# Patient Record
Sex: Female | Born: 1960 | Race: White | Hispanic: No | Marital: Married | Smoking: Never smoker
Health system: Southern US, Community
[De-identification: ages and names within clinical notes are randomized; demographics above are authoritative.]

## PROBLEM LIST (undated history)

## (undated) DIAGNOSIS — J309 Allergic rhinitis, unspecified: Secondary | ICD-10-CM

## (undated) DIAGNOSIS — I341 Nonrheumatic mitral (valve) prolapse: Secondary | ICD-10-CM

## (undated) DIAGNOSIS — I493 Ventricular premature depolarization: Secondary | ICD-10-CM

## (undated) DIAGNOSIS — Q638 Other specified congenital malformations of kidney: Secondary | ICD-10-CM

## (undated) DIAGNOSIS — F411 Generalized anxiety disorder: Secondary | ICD-10-CM

## (undated) DIAGNOSIS — E785 Hyperlipidemia, unspecified: Secondary | ICD-10-CM

## (undated) DIAGNOSIS — E559 Vitamin D deficiency, unspecified: Secondary | ICD-10-CM

## (undated) DIAGNOSIS — R002 Palpitations: Secondary | ICD-10-CM

## (undated) DIAGNOSIS — C801 Malignant (primary) neoplasm, unspecified: Secondary | ICD-10-CM

## (undated) HISTORY — PX: TONSILLECTOMY: SUR1361

## (undated) HISTORY — DX: Malignant (primary) neoplasm, unspecified: C80.1

## (undated) HISTORY — DX: Vitamin D deficiency, unspecified: E55.9

## (undated) HISTORY — DX: Hyperlipidemia, unspecified: E78.5

## (undated) HISTORY — DX: Generalized anxiety disorder: F41.1

## (undated) HISTORY — PX: BREAST ENHANCEMENT SURGERY: SHX7

## (undated) HISTORY — DX: Nonrheumatic mitral (valve) prolapse: I34.1

## (undated) HISTORY — DX: Palpitations: R00.2

## (undated) HISTORY — DX: Other specified congenital malformations of kidney: Q63.8

## (undated) HISTORY — DX: Ventricular premature depolarization: I49.3

## (undated) HISTORY — DX: Allergic rhinitis, unspecified: J30.9

---

## 2012-09-14 ENCOUNTER — Emergency Department: Payer: Self-pay | Admitting: Emergency Medicine

## 2012-09-14 ENCOUNTER — Ambulatory Visit: Payer: Self-pay | Admitting: Physician Assistant

## 2012-09-14 LAB — MAGNESIUM: Magnesium: 2.1 mg/dL

## 2012-09-14 LAB — URINALYSIS, COMPLETE
Bilirubin,UR: NEGATIVE
Glucose,UR: NEGATIVE mg/dL (ref 0–75)
Ketone: NEGATIVE
Leukocyte Esterase: NEGATIVE
Nitrite: NEGATIVE
Ph: 7 (ref 4.5–8.0)
Protein: 30
RBC,UR: 43 /HPF (ref 0–5)
Squamous Epithelial: 1
WBC UR: 1 /HPF (ref 0–5)

## 2012-09-14 LAB — COMPREHENSIVE METABOLIC PANEL
Anion Gap: 6 — ABNORMAL LOW (ref 7–16)
BUN: 11 mg/dL (ref 7–18)
Calcium, Total: 8.9 mg/dL (ref 8.5–10.1)
Chloride: 105 mmol/L (ref 98–107)
Co2: 27 mmol/L (ref 21–32)
EGFR (African American): >60
Potassium: 3.6 mmol/L (ref 3.5–5.1)
SGOT(AST): 21 U/L (ref 15–37)
SGPT (ALT): 28 U/L (ref 12–78)

## 2012-09-14 LAB — PRO B NATRIURETIC PEPTIDE: B-Type Natriuretic Peptide: 68 pg/mL (ref 0–125)

## 2012-09-14 LAB — CK TOTAL AND CKMB (NOT AT ARMC): CK, Total: 46 U/L (ref 21–215)

## 2012-09-14 LAB — CBC
Platelet: 216 10*3/uL (ref 150–440)
RBC: 4.27 10*6/uL (ref 3.80–5.20)
RDW: 13.3 % (ref 11.5–14.5)
WBC: 6 10*3/uL (ref 3.6–11.0)

## 2012-09-14 LAB — TSH: Thyroid Stimulating Horm: 1.41 u[IU]/mL

## 2012-09-19 ENCOUNTER — Encounter: Payer: Self-pay | Admitting: Cardiovascular Disease

## 2012-10-01 ENCOUNTER — Encounter: Payer: Self-pay | Admitting: Cardiovascular Disease

## 2012-10-01 ENCOUNTER — Ambulatory Visit (INDEPENDENT_AMBULATORY_CARE_PROVIDER_SITE_OTHER): Payer: Commercial Indemnity | Admitting: Cardiovascular Disease

## 2012-10-01 VITALS — BP 117/78 | HR 112 | Ht 66.0 in | Wt 183.8 lb

## 2012-10-01 DIAGNOSIS — R079 Chest pain, unspecified: Secondary | ICD-10-CM

## 2012-10-01 DIAGNOSIS — E785 Hyperlipidemia, unspecified: Secondary | ICD-10-CM | POA: Insufficient documentation

## 2012-10-01 DIAGNOSIS — R42 Dizziness and giddiness: Secondary | ICD-10-CM

## 2012-10-01 NOTE — Assessment & Plan Note (Signed)
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 Unable to exclude panic attack or anxiety disorder. 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 Arrhythmia is less likely though certainly a consideration. If symptoms recur, she could repeat her Holter or event monitor.

## 2012-10-01 NOTE — Patient Instructions (Addendum)
You are doing well. No medication changes were made.  Consider taking red yeast rice for cholesterol  We will order a treadmill study  Please call us if you have new issues that need to be addressed before your next appt.

## 2012-10-01 NOTE — Progress Notes (Signed)
Patient ID: Rachel Tate, female    DOB: [birth year], 51 y.o.   MRN: [id number]  HPI Comments: Rachel Tate  is a pleasant 51 year old woman with history of hyperlipidemia, anxiety, PVCs, who presents for new patient evaluation after episode of dizziness, nausea, hypertension.  She reports that she was in the parking lot of [employer] when symptoms started. Acute onset of nausea, walking funny with gait instability, heaviness all over her body, chest pain symptoms, dizziness. She drove to acute care and was sent to the hospital for further evaluation. Workup was essentially normal with negative cardiac enzymes, normal BNP, normal thyroid. She was discharged home with followup today. EKG did show PVCs. Normal head CT scan.  She has since had followup with ENT has suggested she might have had Mnire's disease. She is taking nausea pills whenever she has symptoms. She does not exercise on a regular basis. No significant chest pain with exertion. Some atypical type chest pain.  Palpation studies in 2010 include a stress test/Myoview which showed no ischemia, normal carotid ultrasound, monitor that showed PVCs, and an echocardiogram that suggested mitral valve prolapse and mild to moderate MR, mild TR  Cholesterol in September 2012 was 222, LDL 144  EKG today shows normal sinus rhythm with frequent PVCs   Outpatient Encounter Prescriptions as of 10/01/2012  Medication Sig Dispense Refill  . cholecalciferol (VITAMIN D) 1000 UNITS tablet Take 1,000 Units by mouth daily.      . Drospirenone-Ethinyl Estradiol (YAZ PO) Take by mouth daily.      . fluticasone (FLONASE) 50 MCG/ACT nasal spray Place 2 sprays into the nose daily.      Marland Kitchen LORazepam (ATIVAN) 0.5 MG tablet Take 0.5 mg by mouth as needed.       Marland Kitchen omeprazole (PRILOSEC) 40 MG capsule Take 40 mg by mouth daily.         Review of Systems  Constitutional: Negative.   HENT: Negative.   Eyes: Negative.   Respiratory: Negative.   Cardiovascular:  Positive for chest pain.  Gastrointestinal: Positive for nausea.  Musculoskeletal: Negative.   Skin: Negative.   Neurological: Positive for dizziness.  Hematological: Negative.   Psychiatric/Behavioral: Negative.   All other systems reviewed and are negative.    BP 117/78  Pulse 112  Ht 5\' 6"  (1.676 m)  Wt 183 lb 12 oz (83.348 kg)  BMI 29.66 kg/m2  Physical Exam  Nursing note and vitals reviewed. Constitutional: She is oriented to person, place, and time. She appears well-developed and well-nourished.  HENT:  Head: Normocephalic.  Nose: Nose normal.  Mouth/Throat: Oropharynx is clear and moist.  Eyes: Conjunctivae normal are normal. Pupils are equal, round, and reactive to light.  Neck: Normal range of motion. Neck supple. No JVD present.  Cardiovascular: Normal rate, regular rhythm, S1 normal, S2 normal, normal heart sounds and intact distal pulses.  Exam reveals no gallop and no friction rub.   No murmur heard. Pulmonary/Chest: Effort normal and breath sounds normal. No respiratory distress. She has no wheezes. She has no rales. She exhibits no tenderness.  Abdominal: Soft. Bowel sounds are normal. She exhibits no distension. There is no tenderness.  Musculoskeletal: Normal range of motion. She exhibits no edema and no tenderness.  Lymphadenopathy:    She has no cervical adenopathy.  Neurological: She is alert and oriented to person, place, and time. Coordination normal.  Skin: Skin is warm and dry. No rash noted. No erythema.  Psychiatric: She has a normal mood and affect. Her behavior  is normal. Judgment and thought content normal.         Assessment and Plan

## 2012-10-01 NOTE — Assessment & Plan Note (Signed)
Atypical chest pain symptoms at rest. We will schedule her for a routine treadmill study.

## 2012-10-01 NOTE — Assessment & Plan Note (Signed)
History of high cholesterol. Strong family history of coronary artery disease. She stopped taking interested in starting a statin. We will discuss this with her again on her next visit. We did suggest she could try red yeast rice.

## 2012-10-23 ENCOUNTER — Encounter: Payer: Self-pay | Admitting: Cardiovascular Disease

## 2012-10-23 ENCOUNTER — Ambulatory Visit (INDEPENDENT_AMBULATORY_CARE_PROVIDER_SITE_OTHER): Payer: Commercial Indemnity | Admitting: Cardiovascular Disease

## 2012-10-23 VITALS — BP 118/62 | HR 98 | Ht 66.0 in | Wt 181.8 lb

## 2012-10-23 DIAGNOSIS — R079 Chest pain, unspecified: Secondary | ICD-10-CM

## 2012-10-23 DIAGNOSIS — R42 Dizziness and giddiness: Secondary | ICD-10-CM

## 2012-10-23 NOTE — Patient Instructions (Addendum)
Normal treadmill stress test with no EKG changes concerning for ischemia No further testing needed at this time Followup as needed

## 2012-10-23 NOTE — Procedures (Signed)
Exercise Treadmill Test Treadmill ordered for recent epsiodes of chest pain.  Resting EKG shows NSR with rate of 92 bpm, PVCs noted Resting blood pressure of 118/62. Stand bruce protocal was used.  Patient exercised for 7 min 15 sec,  Peak heart rate of 169 bpm.  This was 100% of the maximum predicted heart rate (target heart rate 169). Achieved 10.1 METS No symptoms of chest pain or lightheadedness were reported at peak stress or in recovery.  Peak Blood pressure recorded was 150/64. Heart rate at 3 minutes in recovery was 71 bpm. No significant ST or T wave changes noted concerning for ischemia  FINAL IMPRESSION: Normal exercise stress test. No significant EKG changes concerning for ischemia. Excellent exercise tolerance.

## 2014-07-16 ENCOUNTER — Ambulatory Visit: Payer: Self-pay | Admitting: Physician Assistant
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
## 2018-09-27 ENCOUNTER — Encounter: Payer: Self-pay | Admitting: Gynecology

## 2018-09-27 ENCOUNTER — Ambulatory Visit
Admission: EM | Admit: 2018-09-27 | Discharge: 2018-09-27 | Disposition: A | Discharge status: Home / Self Care | Payer: BLUE CROSS/BLUE SHIELD | Attending: Family Medicine | Admitting: Family Medicine

## 2018-09-27 ENCOUNTER — Other Ambulatory Visit: Payer: Self-pay

## 2018-09-27 DIAGNOSIS — J988 Other specified respiratory disorders: Secondary | ICD-10-CM

## 2018-09-27 LAB — RAPID STREP SCREEN (MED CTR MEBANE ONLY): Streptococcus, Group A Screen (Direct): NEGATIVE

## 2018-09-27 LAB — RAPID INFLUENZA A&B ANTIGENS
Influenza A (ARMC): NEGATIVE
Influenza B (ARMC): NEGATIVE

## 2018-09-27 MED ORDER — AMOXICILLIN-POT CLAVULANATE 875-125 MG PO TABS: 1.0000 | ORAL_TABLET | Freq: Two times a day (BID) | ORAL | 0 refills | Status: DC

## 2018-09-27 NOTE — ED Provider Notes (Signed)
[facility]    CSN: [id number] Arrival date & time: 09/27/18  1111  History   Chief Complaint URI symptoms  HPI  57 year old female presents with respiratory symptoms.  Has been feeling poorly since Tuesday.  She reports severe sore throat, congestion, ear pain, fatigue.  She states that she has had low-grade fever, T-max 100.  She has used Tylenol and ibuprofen regularly with improvement in fever and improvement in her sore throat.  Symptoms are severe.  Patient states that she has difficulty breathing out of her nose.  She has taken Robitussin, NyQuil without resolution.  No known exacerbating factors.  No other reported symptoms.  No other complaints.  PMH, Surgical Hx, Family Hx, Social History reviewed and updated as below.  Past Medical History:  Diagnosis Date  . Allergic rhinitis   . Cancer ([place])    skin  . Generalized anxiety disorder   . Hyperlipidemia   . Mild mitral valve prolapse   . Other specified congenital anomaly of kidney   . Palpitations   . PVC's (premature ventricular contractions)   . Vitamin D deficiency disease     Patient Active Problem List   Diagnosis Date Noted  . Chest pain 10/01/2012  . Dizziness 10/01/2012  . Hyperlipidemia 10/01/2012    Past Surgical History:  Procedure Laterality Date  . BREAST ENHANCEMENT SURGERY    . TONSILLECTOMY      OB History   None      Home Medications    Prior to Admission medications   Medication Sig Start Date End Date Taking? Authorizing Provider  fluticasone (FLONASE) 50 MCG/ACT nasal spray Place 2 sprays into the nose as needed.    Yes [provider]  LORazepam (ATIVAN) 0.5 MG tablet Take 0.5 mg by mouth as needed.    Yes [provider]  omeprazole (PRILOSEC) 40 MG capsule Take 40 mg by mouth daily.   Yes [provider]  amoxicillin-clavulanate (AUGMENTIN) 875-125 MG tablet Take 1 tablet by mouth every 12 (twelve) hours. 09/27/18   Coral Spikes, DO    meclizine (ANTIVERT) 25 MG tablet Take 25 mg by mouth 3 (three) times daily as needed. 07/08/18   [provider]    Family History Family History  Problem Relation Age of Onset  . Hyperlipidemia Mother   . Hypertension Mother   . Heart attack Father   . Heart disease Father        premature CAD  . Heart disease Sister   . Heart attack Sister   . Heart attack Sister     Social History Social History   Tobacco Use  . Smoking status: Never Smoker  . Smokeless tobacco: Never Used  Substance Use Topics  . Alcohol use: No  . Drug use: No     Allergies   Patient has no known allergies.   Review of Systems Review of Systems Per HPI  Physical Exam Triage Vital Signs ED Triage Vitals  Enc Vitals Group     BP 09/27/18 1135 107/71     Pulse Rate 09/27/18 1135 84     Resp 09/27/18 1135 16     Temp 09/27/18 1135 98.8 F (37.1 C)     Temp Source 09/27/18 1135 Oral     SpO2 09/27/18 1135 98 %     Weight 09/27/18 1137 144 lb (65.3 kg)     Height 09/27/18 1137 5\' 6"  (1.676 m)     Head Circumference --  Peak Flow --      Pain Score 09/27/18 1136 8     Pain Loc --      Pain Edu? --      Excl. in [place]? --    Updated Vital Signs BP 107/71 (BP Location: Left Arm)   Pulse 84   Temp 98.8 F (37.1 C) (Oral)   Resp 16   Ht 5\' 6"  (1.676 m)   Wt 65.3 kg   SpO2 98%   BMI 23.24 kg/m   Visual Acuity Right Eye Distance:   Left Eye Distance:   Bilateral Distance:    Right Eye Near:   Left Eye Near:    Bilateral Near:     Physical Exam  Constitutional: She is oriented to person, place, and time. She appears well-developed. No distress.  HENT:  Head: Normocephalic and atraumatic.  Severe oropharyngeal erythema. Normal TMs bilaterally.  Eyes: Conjunctivae are normal. Right eye exhibits no discharge. Left eye exhibits no discharge.  Cardiovascular: Normal rate and regular rhythm.  Pulmonary/Chest: Effort normal and breath sounds normal. She has no wheezes.  She has no rales.  Neurological: She is alert and oriented to person, place, and time.  Psychiatric: She has a normal mood and affect. Her behavior is normal.  Nursing note and vitals reviewed.  UC Treatments / Results  Labs (all labs ordered are listed, but only abnormal results are displayed) Labs Reviewed  RAPID INFLUENZA A&B ANTIGENS ([facility] ONLY)  RAPID STREP SCREEN ([facility] ONLY)  CULTURE, GROUP A STREP [facility])    EKG None  Radiology No results found.  Procedures Procedures (including critical care time)  Medications Ordered in UC Medications - No data to display  Initial Impression / Assessment and Plan / UC Course  I have reviewed the triage vital signs and the nursing notes.  Pertinent labs & imaging results that were available during my care of the patient were reviewed by me and considered in my medical decision making (see chart for details).     57 year old female presents with a respiratory infection.  Suspect acute sinusitis.  Strep negative.  Flu negative.  Placing on Augmentin.  Final Clinical Impressions(s) / UC Diagnoses   Final diagnoses:  Respiratory infection     Discharge Instructions     Antibiotic as prescribed.  Take care  Dr. Lacinda Axon     ED Prescriptions    Medication Sig Dispense Auth. Provider   amoxicillin-clavulanate (AUGMENTIN) 875-125 MG tablet Take 1 tablet by mouth every 12 (twelve) hours. 14 tablet Coral Spikes, DO     Controlled Substance Prescriptions [place] Controlled Substance Registry consulted? Not Applicable   Coral Spikes, DO 09/27/18 1231

## 2018-09-27 NOTE — ED Triage Notes (Signed)
Patient c/o throat pain/ ear pain / weakness/ low grade fever x 4 days

## 2018-09-27 NOTE — Discharge Instructions (Signed)
Antibiotic as prescribed.  Take care  Dr. Amahd Morino  

## 2018-09-30 ENCOUNTER — Telehealth (HOSPITAL_COMMUNITY): Payer: Self-pay | Admitting: Emergency Medicine

## 2018-09-30 LAB — CULTURE, GROUP A STREP (THRC)

## 2018-09-30 NOTE — Telephone Encounter (Signed)
Culture is positive for non group A Strep germ.  This is a finding of uncertain significance; not the typical 'strep throat' germ.  Pt complains of persistent symptoms.  LVMM Recheck for further evaluation if symptoms are not improving

## 2021-08-31 ENCOUNTER — Ambulatory Visit (INDEPENDENT_AMBULATORY_CARE_PROVIDER_SITE_OTHER): Payer: Self-pay | Admitting: Plastic Surgery

## 2021-08-31 ENCOUNTER — Other Ambulatory Visit: Payer: Self-pay

## 2021-08-31 ENCOUNTER — Encounter: Payer: Self-pay | Admitting: Plastic Surgery

## 2021-08-31 VITALS — BP 109/71 | HR 67 | Ht 66.0 in | Wt 150.0 lb

## 2021-08-31 DIAGNOSIS — Z411 Encounter for cosmetic surgery: Secondary | ICD-10-CM

## 2021-08-31 NOTE — Progress Notes (Signed)
Referring Provider Therapy, [facility] [address] [place],  [place] [id number]   CC:  Chief Complaint  Patient presents with   Advice Only      Rachel Tate is an 60 y.o. female.  HPI: Patient presents to discuss 2 concerns.  She would like to discuss her neck.  She says some years ago she had a neck liposuction procedure performed with what sounds like use of thermal application to the skin with the hope of tightening it.  Subsequent to that she had a lot of wrinkling and contour irregularities in her neck.  She had seen Dr. Dessie Coma for that have who had performed some conservative direct excisions of neck skin which helped her quite a bit with the contour.  She is interested to see if that can be performed again.  She would also like to talk about her breast.  She had implants put in in the past that were subpectoral and saline.  I believe she had a deflation and these were removed some years ago also by Dr. Dessie Coma.  She chose not to replace implants at that time.  She is currently bothered by the descent of her overall breast volume.  She is generally happy with her size but would like to have more fullness in the upper pole and a higher overall placement of the volume on her chest.  No Known Allergies  Outpatient Encounter Medications as of 08/31/2021  Medication Sig   LORazepam (ATIVAN) 0.5 MG tablet Take 0.5 mg by mouth as needed.    meclizine (ANTIVERT) 25 MG tablet Take 25 mg by mouth 3 (three) times daily as needed.   omeprazole (PRILOSEC) 40 MG capsule Take 40 mg by mouth daily.   [DISCONTINUED] amoxicillin-clavulanate (AUGMENTIN) 875-125 MG tablet Take 1 tablet by mouth every 12 (twelve) hours. (Patient not taking: Reported on 08/31/2021)   [DISCONTINUED] fluticasone (FLONASE) 50 MCG/ACT nasal spray Place 2 sprays into the nose as needed.  (Patient not taking: Reported on 08/31/2021)   No facility-administered encounter medications on file as of  08/31/2021.     Past Medical History:  Diagnosis Date   Allergic rhinitis    Cancer ([place])    skin   Generalized anxiety disorder    Hyperlipidemia    Mild mitral valve prolapse    Other specified congenital anomaly of kidney    Palpitations    PVC's (premature ventricular contractions)    Vitamin D deficiency disease     Past Surgical History:  Procedure Laterality Date   BREAST ENHANCEMENT SURGERY     TONSILLECTOMY      Family History  Problem Relation Age of Onset   Hyperlipidemia Mother    Hypertension Mother    Heart attack Father    Heart disease Father        premature CAD   Heart disease Sister    Heart attack Sister    Heart attack Sister     Social History   Social History Narrative   Not on file  Denies tobacco use  Review of Systems General: Denies fevers, chills, weight loss CV: Denies chest pain, shortness of breath, palpitations  Physical Exam Vitals with BMI 08/31/2021 09/27/2018 10/23/2012  Height 5\' 6"  5\' 6"  5\' 6"   Weight 150 lbs 144 lbs 181 lbs 12 oz  BMI 24.22 36.14 43.1  Systolic 540 086 761  Diastolic 71 71 62  Pulse 67 84 98    General:  No acute distress,  Alert and oriented, Non-Toxic, Normal speech and affect Examination of the neck shows several small linear scars from her previous excisions.  These are generally not very noticeable.  There is a longer one in the left side in the submandibular area and to others focused on the right side that are somewhat smaller.  She does have a small contour depression in the right submental area that I think might be able to be released through her pre-existing incisions.  A conservative amount of skin excision I believe does improve the contour. Breast: She has at least grade 2 ptosis and some pseudoptosis.  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  Inframammary scars from her previous implant placements.  She has reasonable symmetry.  Assessment/Plan Had a long discussion with the  patient about her neck.  I do think it would be reasonable to excise a little bit of additional skin along her pre-existing scars.  This should not had much in the way of risk or scarring.  I explained the current scars that she had would be slightly longer due to the excision but overall I do think a conservative excision there would offer some mild benefit.  She understands the limitations of this procedure.  I did talk to her briefly about a facelift but she would prefer not to engage in something as invasive as that at this time. Regarding her breast we discussed a breast lift plus or minus fat grafting or an implant.  We did discuss fat grafting I think that would be a reasonable option to add volume to the upper pole although there is a limited amount of volume that could be applied depending on her goals.  She is currently a bit undecided on how much she wants to add in terms of volume and wants to think about that.  We will plan to provide quotes for her.  Cindra Presume 08/31/2021, 11:29 AM

## 2021-10-25 ENCOUNTER — Ambulatory Visit: Payer: BLUE CROSS/BLUE SHIELD | Admitting: Plastic Surgery

## 2021-11-02 ENCOUNTER — Other Ambulatory Visit: Payer: Self-pay

## 2021-11-02 ENCOUNTER — Ambulatory Visit (INDEPENDENT_AMBULATORY_CARE_PROVIDER_SITE_OTHER): Payer: Self-pay | Admitting: Plastic Surgery

## 2021-11-02 VITALS — BP 113/73 | HR 63 | Ht 66.5 in | Wt 159.8 lb

## 2021-11-02 DIAGNOSIS — Z411 Encounter for cosmetic surgery: Secondary | ICD-10-CM

## 2021-11-02 NOTE — Progress Notes (Signed)
Operative Note   DATE OF OPERATION: 11/02/2021  LOCATION:    SURGICAL DEPARTMENT: Plastic Surgery  PREOPERATIVE DIAGNOSES: Contour irregularities in the neck  POSTOPERATIVE DIAGNOSES:  same  PROCEDURE:  Submandibular skin excision for neck skin tightening  SURGEON: Talmadge Coventry, MD  ANESTHESIA:  Local  COMPLICATIONS: None.   INDICATIONS FOR PROCEDURE:  The patient, Rachel Tate is a 61 y.o. female born on 1961/07/11, is here for treatment of skin contour irregularities.  She had a Thermo tight neck liposuction years ago which resulted in some undesired contour irregularities in the neck.  Dr. Dessie Coma has previously done some limited skin excisions to try and tighten the skin and she has a pre-existing submental scar extending further over to the left side.  She desired an additional skin excision to try to help with the contour as it changes over time.  Reviewed risks and benefits of the procedure.  I did state that ultimately she may benefit from a neck lift or facelift but she is not interested in those procedures at this time.  Given the presence of a scar I felt it was reasonable to try a limited excision with some tethering release to try and improve her contour. MRN: [id number]  CONSENT:  Informed consent was obtained directly from the patient. Risks, benefits and alternatives were fully discussed. Specific risks including but not limited to bleeding, infection, hematoma, seroma, scarring, pain, infection, wound healing problems, and need for further surgery were all discussed. The patient did have an ample opportunity to have questions answered to satisfaction.   DESCRIPTION OF PROCEDURE:  Local anesthesia was administered. The patient's operative site was prepped and draped in a sterile fashion. A time out was performed and all information was confirmed to be correct.  The pre-existing scar was marked out carefully.  I then marked out a planned excision of several  millimeters of skin on either side saw for some tightening.  This was then excised with a 15 blade.  Limited undermining was performed particularly inferiorly and the skin was then advanced and closed with interrupted buried 4 Monocryl sutures and a running 5-0 fast gut.  The patient tolerated the procedure well.  There were no complications.

## 2021-11-15 ENCOUNTER — Encounter: Payer: Self-pay | Admitting: Plastic Surgery

## 2021-11-15 ENCOUNTER — Ambulatory Visit (INDEPENDENT_AMBULATORY_CARE_PROVIDER_SITE_OTHER): Payer: BLUE CROSS/BLUE SHIELD | Admitting: Plastic Surgery

## 2021-11-15 ENCOUNTER — Other Ambulatory Visit: Payer: Self-pay

## 2021-11-15 ENCOUNTER — Ambulatory Visit (INDEPENDENT_AMBULATORY_CARE_PROVIDER_SITE_OTHER): Payer: Self-pay | Admitting: Plastic Surgery

## 2021-11-15 VITALS — BP 112/78 | HR 68 | Ht 66.0 in | Wt 158.0 lb

## 2021-11-15 DIAGNOSIS — Z411 Encounter for cosmetic surgery: Secondary | ICD-10-CM

## 2021-11-15 NOTE — Progress Notes (Signed)
Patient presents for follow-up after a neck skin excision to improve contour.  She is very happy with the results.  On exam this looks to be healing nicely.  I do believe that improved her contour and took out some of the laxity and contour irregularities.  All the sutures are dissolved and will we will plan to start scar creams and tape and follow-up with her in a few months to evaluate the scar.

## 2021-11-15 NOTE — Progress Notes (Signed)
Referring Provider Therapy, [facility] [address],  [place] [address]   CC:  Chief Complaint  Patient presents with   Consult      Rachel Tate is an 61 y.o. female.  HPI: Patient presents to discuss breast lift with fat grafting.  We had previously discussed this during a neck skin excision done to improve skin contour in that area.  She had a history of implants placed which were textured and subsequently removed by Dr. Dessie Coma in 2018.  Her current concerns are the ptosis of her breast and she would like a little bit more volume if possible.  She is up-to-date on her mammograms and has had no previous breast biopsies or procedures other than the cosmetic mentioned above.  She does not smoke and is not a diabetic and does not take blood thinners.  She is currently a C cup and would like to be around a D cup.  No Known Allergies  Outpatient Encounter Medications as of 11/15/2021  Medication Sig   LORazepam (ATIVAN) 0.5 MG tablet Take 0.5 mg by mouth as needed.    meclizine (ANTIVERT) 25 MG tablet Take 25 mg by mouth 3 (three) times daily as needed.   omeprazole (PRILOSEC) 40 MG capsule Take 40 mg by mouth daily.   No facility-administered encounter medications on file as of 11/15/2021.     Past Medical History:  Diagnosis Date   Allergic rhinitis    Cancer ([facility])    skin   Generalized anxiety disorder    Hyperlipidemia    Mild mitral valve prolapse    Other specified congenital anomaly of kidney    Palpitations    PVC's (premature ventricular contractions)    Vitamin D deficiency disease     Past Surgical History:  Procedure Laterality Date   BREAST ENHANCEMENT SURGERY     TONSILLECTOMY      Family History  Problem Relation Age of Onset   Hyperlipidemia Mother    Hypertension Mother    Heart attack Father    Heart disease Father        premature CAD   Heart disease Sister    Heart attack Sister    Heart attack Sister     Social  History   Social History Narrative   Not on file     Review of Systems General: Denies fevers, chills, weight loss CV: Denies chest pain, shortness of breath, palpitations  Physical Exam Vitals with BMI 11/15/2021 11/02/2021 08/31/2021  Height 5\' 6"  5' 6.5" 5\' 6"   Weight 158 lbs 159 lbs 13 oz 150 lbs  BMI 25.51 50.27 74.12  Systolic 878 676 720  Diastolic 78 73 71  Pulse 68 63 67    General:  No acute distress,  Alert and oriented, Non-Toxic, Normal speech and affect Breast: She has grade 2 ptosis.  There is a lack of fullness in the upper pole.  Otherwise she is reasonably symmetric at baseline. Abdomen: Abdomen is soft nontender.  No obvious hernias.  Mild to moderate skin and adipose tissue excess.  Assessment/Plan Patient is a good candidate for bilateral mastopexy with fat grafting.  I discussed she would need a Wise pattern skin excision and would get a mild to moderate improvement in volume through the fat grafting.  We discussed risks of the procedure that include bleeding, infection, damage to surrounding structures and need for additional procedures.  We discussed the anticipated recovery after surgery along with the location and  orientation of the scars.  All her questions were answered and we will plan to move forward.  Cindra Presume 11/15/2021, 4:27 PM

## 2021-12-26 ENCOUNTER — Telehealth: Payer: Self-pay

## 2021-12-26 NOTE — Telephone Encounter (Signed)
Called patient, LMVM to follow up if she is still interested in having BL mastopexy with/grafting in April. Currently we have a slot still available on 02/19/2022 at 11:30. Call back soon due to slots fill up fast this time of the year.
